# Patient Record
Sex: Female | Born: 2000 | Race: White | Hispanic: No | Marital: Single | Smoking: Never smoker
Health system: Southern US, Community
[De-identification: ages and names within clinical notes are randomized; demographics above are authoritative.]

## PROBLEM LIST (undated history)

## (undated) ENCOUNTER — Inpatient Hospital Stay (HOSPITAL_COMMUNITY): Payer: Self-pay

## (undated) DIAGNOSIS — F419 Anxiety disorder, unspecified: Secondary | ICD-10-CM

## (undated) DIAGNOSIS — F32A Depression, unspecified: Secondary | ICD-10-CM

## (undated) DIAGNOSIS — O139 Gestational [pregnancy-induced] hypertension without significant proteinuria, unspecified trimester: Secondary | ICD-10-CM

## (undated) DIAGNOSIS — F909 Attention-deficit hyperactivity disorder, unspecified type: Secondary | ICD-10-CM

## (undated) DIAGNOSIS — Z789 Other specified health status: Secondary | ICD-10-CM

## (undated) HISTORY — DX: Depression, unspecified: F32.A

## (undated) HISTORY — PX: NO PAST SURGERIES: SHX2092

## (undated) HISTORY — DX: Attention-deficit hyperactivity disorder, unspecified type: F90.9

## (undated) HISTORY — DX: Anxiety disorder, unspecified: F41.9

## (undated) HISTORY — DX: Gestational (pregnancy-induced) hypertension without significant proteinuria, unspecified trimester: O13.9

---
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
## 2019-06-05 ENCOUNTER — Ambulatory Visit (INDEPENDENT_AMBULATORY_CARE_PROVIDER_SITE_OTHER): Payer: Medicaid Other | Admitting: Advanced Practice Midwife

## 2019-06-05 ENCOUNTER — Encounter: Payer: Self-pay | Admitting: Advanced Practice Midwife

## 2019-06-05 DIAGNOSIS — Z3009 Encounter for other general counseling and advice on contraception: Secondary | ICD-10-CM | POA: Diagnosis not present

## 2019-06-05 DIAGNOSIS — Z975 Presence of (intrauterine) contraceptive device: Secondary | ICD-10-CM

## 2019-06-05 NOTE — Progress Notes (Signed)
I connected with Shawntell S Fiero on 06/05/19 at  2:55 PM EDT by telephone and verified that I am speaking with the correct person using two identifiers.  Pt c/o dull achy cramping pain and "pinkish/brownish" vaginal discharge with odor.  Pt thinks sx's are related to the IUD so she wants to remove it.  IUD inserted 03/11/18

## 2019-06-05 NOTE — Progress Notes (Signed)
   [facility] VIRTUAL GYNECOLOGY VISIT ENCOUNTER NOTE  I connected with Karen Spence on 06/05/19 at  2:55 PM EDT by telephone at home and verified that I am speaking with the correct person using two identifiers.   I discussed the limitations, risks, security and privacy concerns of performing an evaluation and management service by telephone and the availability of in person appointments. I also discussed with the patient that there may be a patient responsible charge related to this service. The patient expressed understanding and agreed to proceed.   History:  Karen Spence is a 18 y.o. G58P1001 female being evaluated today for abnormal vaginal discharge and desire to have her Verdia Kuba IUD removed. Patient endorses one month history of brown-tinged vaginal discharge. Onset of vaginal discharge coincides with sexual activity. Patient has had her Kyleena since 03/11/2018 and is concerned that it is the source of her vaginal discharge. She denies  bleeding, pelvic pain or other concerns.  She does not check her strings.     Past Medical History:  Diagnosis Date  . ADHD    Past Surgical History:  Procedure Laterality Date  . NO PAST SURGERIES     The following portions of the patient's history were reviewed and updated as appropriate: allergies, current medications, past family history, past medical history, past social history, past surgical history and problem list.   Health Maintenance:  No pap or mammogram history (age 43).  Review of Systems:  Pertinent items noted in HPI and remainder of comprehensive ROS otherwise negative.  Physical Exam:   General:  Alert, oriented and cooperative.   Mental Status: Normal mood and affect perceived. Normal judgment and thought content.  Physical exam deferred due to nature of the encounter  Labs and Imaging  Assessment and Plan:     1. Birth control counseling --Patient has been successful and content with her IUD until about one  month ago. Given age and excellent success rate of IUD,advised keeping IUD in place, making appointment for cervicovaginal swab, treating PRN and reevaluating need for IUD removal --Patient desires removal ASAP --Reviewed all forms of birth control options available including abstinence; fertility period awareness methods; over the counter/barrier methods; hormonal contraceptive medication including pill, patch, ring, injection,contraceptive implant; hormonal and nonhormonal IUDs. I did not discuss permanent sterilization options including vasectomy and the various tubal sterilization modalities. Risks and benefits reviewed.  Questions were answered.  Information was given to patient to review.  --Patient referred to bedsider.org for comparison tools and patient interviews   2. IUD (intrauterine device) in place --[place] --Placed 03/11/2018   I discussed the assessment and treatment plan with the patient. The patient was provided an opportunity to ask questions and all were answered. The patient agreed with the plan and demonstrated an understanding of the instructions.   The patient was advised to call back or seek an in-person evaluation/go to the ED if the symptoms worsen or if the condition fails to improve as anticipated.  Follow-up: --Patient plans to schedule appointment for IUD removal and vaginal swab  I provided 15 minutes of face-to-face time via Webex during this encounter.   Darlina Rumpf, [place] for [employer], [place]

## 2019-06-17 ENCOUNTER — Ambulatory Visit (INDEPENDENT_AMBULATORY_CARE_PROVIDER_SITE_OTHER): Payer: Medicaid Other | Admitting: Family Medicine

## 2019-06-17 ENCOUNTER — Other Ambulatory Visit (HOSPITAL_COMMUNITY)
Admission: RE | Admit: 2019-06-17 | Discharge: 2019-06-17 | Disposition: A | Discharge status: Home / Self Care | Payer: Medicaid Other | Source: Ambulatory Visit | Attending: Family Medicine | Admitting: Family Medicine

## 2019-06-17 ENCOUNTER — Other Ambulatory Visit: Payer: Self-pay

## 2019-06-17 ENCOUNTER — Encounter: Payer: Self-pay | Admitting: Family Medicine

## 2019-06-17 VITALS — BP 114/71 | HR 91 | Wt 111.0 lb

## 2019-06-17 DIAGNOSIS — Z30432 Encounter for removal of intrauterine contraceptive device: Secondary | ICD-10-CM

## 2019-06-17 DIAGNOSIS — N898 Other specified noninflammatory disorders of vagina: Secondary | ICD-10-CM

## 2019-06-17 DIAGNOSIS — Z8759 Personal history of other complications of pregnancy, childbirth and the puerperium: Secondary | ICD-10-CM | POA: Insufficient documentation

## 2019-06-17 NOTE — Progress Notes (Signed)
   Subjective:    Patient ID: Karen Spence is a 18 y.o. female presenting with Gynecologic Exam (iud removal)  on 06/17/2019  HPI: Here today for IUD removal. Notes pain and bleeding following intercourse. Also 1 month h/o vaginal discharge. Working in [employer]. Lives with boyfriend and 20 yo daughter. Does not want any other contraception. OK with pregnancy.  Review of Systems  Constitutional: Negative for chills and fever.  Respiratory: Negative for shortness of breath.   Cardiovascular: Negative for chest pain.  Gastrointestinal: Negative for abdominal pain, nausea and vomiting.  Genitourinary: Positive for vaginal discharge. Negative for dysuria.  Skin: Negative for rash.      Objective:    BP 114/71   Pulse 91   Wt 111 lb (50.3 kg)  Physical Exam Constitutional:      General: She is not in acute distress.    Appearance: She is well-developed.  HENT:     Head: Normocephalic and atraumatic.  Eyes:     General: No scleral icterus. Neck:     Musculoskeletal: Neck supple.  Cardiovascular:     Rate and Rhythm: Normal rate.  Pulmonary:     Effort: Pulmonary effort is normal.  Abdominal:     Palpations: Abdomen is soft.  Skin:    General: Skin is warm and dry.  Neurological:     Mental Status: She is alert and oriented to person, place, and time.    Procedure: Speculum placed inside vagina.  Cervix visualized.  Strings grasped with ring forceps.  IUD removed intact.      Assessment & Plan:   Total face-to-face time with patient: 10 minutes. Over 50% of encounter was spent on counseling and coordination of care. Return if symptoms worsen or fail to improve.  Donnamae Jude 06/17/2019 11:24 AM

## 2019-06-18 LAB — CERVICOVAGINAL ANCILLARY ONLY
Bacterial vaginitis: NEGATIVE
Candida vaginitis: NEGATIVE
Chlamydia: NEGATIVE
Neisseria Gonorrhea: NEGATIVE
Trichomonas: NEGATIVE

## 2019-09-20 ENCOUNTER — Inpatient Hospital Stay (HOSPITAL_COMMUNITY)
Admission: AD | Admit: 2019-09-20 | Discharge: 2019-09-20 | Disposition: A | Discharge status: Home / Self Care | Payer: Medicaid Other | Attending: Obstetrics & Gynecology | Admitting: Obstetrics & Gynecology

## 2019-09-20 ENCOUNTER — Encounter (HOSPITAL_COMMUNITY): Payer: Self-pay

## 2019-09-20 ENCOUNTER — Other Ambulatory Visit: Payer: Self-pay

## 2019-09-20 DIAGNOSIS — O26891 Other specified pregnancy related conditions, first trimester: Secondary | ICD-10-CM

## 2019-09-20 DIAGNOSIS — R55 Syncope and collapse: Secondary | ICD-10-CM

## 2019-09-20 DIAGNOSIS — Z3A13 13 weeks gestation of pregnancy: Secondary | ICD-10-CM | POA: Insufficient documentation

## 2019-09-20 HISTORY — DX: Other specified health status: Z78.9

## 2019-09-20 LAB — URINALYSIS, ROUTINE W REFLEX MICROSCOPIC
Bilirubin Urine: NEGATIVE
Glucose, UA: NEGATIVE mg/dL
Hgb urine dipstick: NEGATIVE
Ketones, ur: NEGATIVE mg/dL
Leukocytes,Ua: NEGATIVE
Nitrite: NEGATIVE
Protein, ur: NEGATIVE mg/dL
Specific Gravity, Urine: 1.014 (ref 1.005–1.030)
pH: 7 (ref 5.0–8.0)

## 2019-09-20 LAB — CBC
HCT: 36.8 % (ref 36.0–46.0)
Hemoglobin: 12 g/dL (ref 12.0–15.0)
MCH: 27.7 pg (ref 26.0–34.0)
MCHC: 32.6 g/dL (ref 30.0–36.0)
MCV: 85 fL (ref 80.0–100.0)
Platelets: 203 10*3/uL (ref 150–400)
RBC: 4.33 MIL/uL (ref 3.87–5.11)
RDW: 13.2 % (ref 11.5–15.5)
WBC: 6.2 10*3/uL (ref 4.0–10.5)
nRBC: 0 % (ref 0.0–0.2)

## 2019-09-20 LAB — POCT PREGNANCY, URINE: Preg Test, Ur: POSITIVE — AB

## 2019-09-20 NOTE — MAU Provider Note (Signed)
History     CSN: [id number]  Arrival date and time: 09/20/19 1237   First Provider Initiated Contact with Patient 09/20/19 1655     Chief Complaint  Patient presents with  . Loss of Consciousness   Karen Spence is a 18 y.o. G2P1 at [redacted]w[redacted]d who presents to MAU with complaints of syncope. She reports this occurred yesterday and today is experiencing no symptoms. She reports laying in the bed and getting up to go to restroom because her fiance called her, when she got up to head to restroom vision began to get blurry and she passed out and fell on back. She reports this occurred in previous pregnancy and has occurred twice in this pregnancy. She denies abdominal pain, back pain, vaginal bleeding or discharge. She receives prenatal care at [facility].    OB History    Gravida  2   Para  1   Term  1   Preterm      AB      Living  1     SAB      TAB      Ectopic      Multiple      Live Births              Past Medical History:  Diagnosis Date  . Medical history non-contributory     Past Surgical History:  Procedure Laterality Date  . NO PAST SURGERIES      Family History  Problem Relation Age of Onset  . Diabetes Father   . Diabetes Maternal Grandmother     Social History   Tobacco Use  . Smoking status: Never Smoker  . Tobacco comment: once or twice she states  Substance Use Topics  . Alcohol use: Not Currently  . Drug use: Not Currently    Allergies: No Known Allergies  No medications prior to admission.    Review of Systems  Constitutional: Negative.   Respiratory: Negative.   Cardiovascular: Negative.   Gastrointestinal: Negative.   Genitourinary: Negative.   Musculoskeletal: Negative.   Neurological: Positive for syncope. Negative for dizziness and weakness.       Yesterday   Physical Exam   Blood pressure 113/68, pulse 63, temperature 98.5 F (36.9 C), temperature source Oral, resp. rate 16, SpO2 100 %.  Physical Exam   Nursing note and vitals reviewed. Constitutional: She is oriented to person, place, and time. She appears well-developed and well-nourished. No distress.  Cardiovascular: Normal rate and regular rhythm.  Respiratory: Effort normal and breath sounds normal. No respiratory distress. She has no wheezes.  GI: Soft. There is no abdominal tenderness. There is no rebound and no guarding.  Musculoskeletal: Normal range of motion.        General: No edema.  Neurological: She is alert and oriented to person, place, and time. She displays normal reflexes. No cranial nerve deficit. She exhibits normal muscle tone. Coordination normal.  Skin: Skin is warm and dry.  Psychiatric: She has a normal mood and affect. Her behavior is normal. Thought content normal.   FHR 154 by doppler   MAU Course  Procedures  MDM CBC  Orthostatic vital signs   Labs reviewed:  Results for orders placed or performed during the hospital encounter of 09/20/19 (from the past 24 hour(s))  Pregnancy, urine POC     Status: Abnormal   Collection Time: 09/20/19  1:31 PM  Result Value Ref Range   Preg Test, Ur POSITIVE (A) NEGATIVE  Urinalysis, Routine  w reflex microscopic     Status: Abnormal   Collection Time: 09/20/19  2:03 PM  Result Value Ref Range   Color, Urine YELLOW YELLOW   APPearance HAZY (A) CLEAR   Specific Gravity, Urine 1.014 1.005 - 1.030   pH 7.0 5.0 - 8.0   Glucose, UA NEGATIVE NEGATIVE mg/dL   Hgb urine dipstick NEGATIVE NEGATIVE   Bilirubin Urine NEGATIVE NEGATIVE   Ketones, ur NEGATIVE NEGATIVE mg/dL   Protein, ur NEGATIVE NEGATIVE mg/dL   Nitrite NEGATIVE NEGATIVE   Leukocytes,Ua NEGATIVE NEGATIVE  CBC     Status: None   Collection Time: 09/20/19  2:12 PM  Result Value Ref Range   WBC 6.2 4.0 - 10.5 K/uL   RBC 4.33 3.87 - 5.11 MIL/uL   Hemoglobin 12.0 12.0 - 15.0 g/dL   HCT 36.8 36.0 - 46.0 %   MCV 85.0 80.0 - 100.0 fL   MCH 27.7 26.0 - 34.0 pg   MCHC 32.6 30.0 - 36.0 g/dL   RDW 13.2 11.5  - 15.5 %   Platelets 203 150 - 400 K/uL   nRBC 0.0 0.0 - 0.2 %   Orthostatic vital signs: 115/63, 112/63, 103/57, and 108/64  Educated and discussed slow position changes, eating every 2-3 hours, increasing water consumption to 6-8 bottles per day. Discussed reasons to return to MAU, follow up as scheduled for prenatal appointments. Pt stable at time of discharge.  Assessment and Plan   1. Syncope, unspecified syncope type   2. [redacted] weeks gestation of pregnancy    Discharge home  Follow up as scheduled for prenatal appointments  Return to MAU as needed  [place], [facility] Follow up.   Specialty: Obstetrics and Gynecology Contact information: [place] [phone] [place] 09/20/2019, 7:27 PM

## 2019-09-20 NOTE — MAU Note (Signed)
Labs being drawn in triage

## 2019-09-20 NOTE — MAU Note (Signed)
Karen Spence Score is a 18 y.o.  here in MAU reporting: states last night when she was at home her vision went black and she passed out. Took a nap and woke up with a headache. Thinks her BP was high. States she currently feels okay. No pain or bleeding.   Onset of complaint: last night  Pain score: 0/10  Vitals:   09/20/19 1329  BP: 113/68  Pulse: 63  Resp: 16  Temp: 98.5 F (36.9 C)  SpO2: 100%     FHT: 154  Lab orders placed from triage: UA, UPT

## 2019-09-20 NOTE — Discharge Instructions (Signed)
Eating Plan for Pregnant Women While you are pregnant, your body requires additional nutrition to help support your growing baby. You also have a higher need for some vitamins and minerals, such as folic acid, calcium, iron, and vitamin D. Eating a healthy, well-balanced diet is very important for your health and your baby's health. Your need for extra calories varies for the three 53-monthsegments of your pregnancy (trimesters). For most women, it is recommended to consume:  150 extra calories a day during the first trimester.  300 extra calories a day during the second trimester.  300 extra calories a day during the third trimester. What are tips for following this plan?   Do not try to lose weight or go on a diet during pregnancy.  Limit your overall intake of foods that have "empty calories." These are foods that have little nutritional value, such as sweets, desserts, candies, and sugar-sweetened beverages.  Eat a variety of foods (especially fruits and vegetables) to get a full range of vitamins and minerals.  Take a prenatal vitamin to help meet your additional vitamin and mineral needs during pregnancy, specifically for folic acid, iron, calcium, and vitamin D.  Remember to stay active. Ask your health care provider what types of exercise and activities are safe for you.  Practice good food safety and cleanliness. Wash your hands before you eat and after you prepare raw meat. Wash all fruits and vegetables well before peeling or eating. Taking these actions can help to prevent food-borne illnesses that can be very dangerous to your baby, such as listeriosis. Ask your health care provider for more information about listeriosis. What does 150 extra calories look like? Healthy options that provide 150 extra calories each day could be any of the following:  6-8 oz (170-230 g) of plain low-fat yogurt with  cup of berries.  1 apple with 2 teaspoons (11 g) of peanut butter.  Cut-up  vegetables with  cup (60 g) of hummus.  8 oz (230 mL) or 1 cup of low-fat chocolate milk.  1 stick of string cheese with 1 medium orange.  1 peanut butter and jelly sandwich that is made with one slice of whole-wheat bread and 1 tsp (5 g) of peanut butter. For 300 extra calories, you could eat two of those healthy options each day. What is a healthy amount of weight to gain? The right amount of weight gain for you is based on your BMI before you became pregnant. If your BMI:  Was less than 18 (underweight), you should gain 28-40 lb (13-18 kg).  Was 18-24.9 (normal), you should gain 25-35 lb (11-16 kg).  Was 25-29.9 (overweight), you should gain 15-25 lb (7-11 kg).  Was 30 or greater (obese), you should gain 11-20 lb (5-9 kg). What if I am having twins or multiples? Generally, if you are carrying twins or multiples:  You may need to eat 300-600 extra calories a day.  The recommended range for total weight gain is 25-54 lb (11-25 kg), depending on your BMI before pregnancy.  Talk with your health care provider to find out about nutritional needs, weight gain, and exercise that is right for you. What foods can I eat?  Grains All grains. Choose whole grains, such as whole-wheat bread, oatmeal, or brown rice. Vegetables All vegetables. Eat a variety of colors and types of vegetables. Remember to wash your vegetables well before peeling or eating. Fruits All fruits. Eat a variety of colors and types of fruit. Remember to wash  your fruits well before peeling or eating. Meats and other protein foods Lean meats, including chicken, [place], fish, and lean cuts of beef, veal, or pork. If you eat fish or seafood, choose options that are higher in omega-3 fatty acids and lower in mercury, such as salmon, herring, mussels, trout, sardines, pollock, shrimp, crab, and lobster. Tofu. Tempeh. Beans. Eggs. Peanut butter and other nut butters. Make sure that all meats, poultry, and eggs are cooked to  food-safe temperatures or "well-done." Two or more servings of fish are recommended each week in order to get the most benefits from omega-3 fatty acids that are found in seafood. Choose fish that are lower in mercury. You can find more information online:  GuamGaming.ch Dairy Pasteurized milk and milk alternatives (such as almond milk). Pasteurized yogurt and pasteurized cheese. Cottage cheese. Sour cream. Beverages Water. Juices that contain 100% fruit juice or vegetable juice. Caffeine-free teas and decaffeinated coffee. Drinks that contain caffeine are okay to drink, but it is better to avoid caffeine. Keep your total caffeine intake to less than 200 mg each day (which is 12 oz or 355 mL of coffee, tea, or soda) or the limit as told by your health care provider. Fats and oils Fats and oils are okay to include in moderation. Sweets and desserts Sweets and desserts are okay to include in moderation. Seasoning and other foods All pasteurized condiments. The items listed above may not be a complete list of recommended foods and beverages. Contact your dietitian for more options. The items listed above may not be a complete list of foods and beverages [you/your child] can eat. Contact a dietitian for more information. What foods are not recommended? Vegetables Raw (unpasteurized) vegetable juices. Fruits Unpasteurized fruit juices. Meats and other protein foods Lunch meats, bologna, hot dogs, or other deli meats. (If you must eat those meats, reheat them until they are steaming hot.) Refrigerated pat, meat spreads from a meat counter, smoked seafood that is found in the refrigerated section of a store. Raw or undercooked meats, poultry, and eggs. Raw fish, such as sushi or sashimi. Fish that have high mercury content, such as tilefish, shark, swordfish, and king mackerel. To learn more about mercury in fish, talk with your health care provider or look for online resources, such  as:  [employer]Gaming.ch Dairy Raw (unpasteurized) milk and any foods that have raw milk in them. Soft cheeses, such as feta, queso blanco, queso fresco, Brie, Camembert cheeses, blue-veined cheeses, and Panela cheese (unless it is made with pasteurized milk, which must be stated on the label). Beverages Alcohol. Sugar-sweetened beverages, such as sodas, teas, or energy drinks. Seasoning and other foods Homemade fermented foods and drinks, such as pickles, sauerkraut, or kombucha drinks. (Store-bought pasteurized versions of these are okay.) Salads that are made in a store or deli, such as ham salad, chicken salad, egg salad, tuna salad, and seafood salad. The items listed above may not be a complete list of foods and beverages to avoid. Contact your dietitian for more information. The items listed above may not be a complete list of foods and beverages [you/your child] should avoid. Contact a dietitian for more information. Where to find more information To calculate the number of calories you need based on your height, weight, and activity level, you can use an online calculator such as:  MobileTransition.ch To calculate how much weight you should gain during pregnancy, you can use an online pregnancy weight gain calculator such as:  StreamingFood.com.cy Summary  While you  are pregnant, your body requires additional nutrition to help support your growing baby.  Eat a variety of foods, especially fruits and vegetables to get a full range of vitamins and minerals.  Practice good food safety and cleanliness. Wash your hands before you eat and after you prepare raw meat. Wash all fruits and vegetables well before peeling or eating. Taking these actions can help to prevent food-borne illnesses, such as listeriosis, that can be very dangerous to your baby.  Do not eat raw meat or fish. Do not eat fish that have high mercury content, such as tilefish,  shark, swordfish, and king mackerel. Do not eat unpasteurized (raw) dairy.  Take a prenatal vitamin to help meet your additional vitamin and mineral needs during pregnancy, specifically for folic acid, iron, calcium, and vitamin D. This information is not intended to replace advice given to you by your health care provider. Make sure you discuss any questions you have with your health care provider. Document Released: 09/25/2014 Document Revised: 04/03/2019 Document Reviewed: 09/07/2017 Elsevier Patient Education  [place] Medications in Pregnancy   Acne: Benzoyl Peroxide Salicylic Acid  Backache/Headache: Tylenol: 2 regular strength every 4 hours OR              2 Extra strength every 6 hours  Colds/Coughs/Allergies: Benadryl (alcohol free) 25 mg every 6 hours as needed Breath right strips Claritin Cepacol throat lozenges Chloraseptic throat spray Cold-Eeze- up to three times per day Cough drops, alcohol free Flonase (by prescription only) Guaifenesin Mucinex Robitussin DM (plain only, alcohol free) Saline nasal spray/drops Sudafed (pseudoephedrine) & Actifed ** use only after [redacted] weeks gestation and if you do not have high blood pressure Tylenol Vicks Vaporub Zinc lozenges Zyrtec   Constipation: Colace Ducolax suppositories Fleet enema Glycerin suppositories Metamucil Milk of magnesia Miralax Senokot Smooth move tea  Diarrhea: Kaopectate Imodium A-D  *NO pepto Bismol  Hemorrhoids: Anusol Anusol HC Preparation H Tucks  Indigestion: Tums Maalox Mylanta Zantac  Pepcid  Insomnia: Benadryl (alcohol free) 25mg  every 6 hours as needed Tylenol PM Unisom, no Gelcaps  Leg Cramps: Tums MagGel  Nausea/Vomiting:  Bonine Dramamine Emetrol Ginger extract Sea bands Meclizine  Nausea medication to take during pregnancy:  Unisom (doxylamine succinate 25 mg tablets) Take one tablet daily at bedtime. If symptoms are not adequately  controlled, the dose can be increased to a maximum recommended dose of two tablets daily (1/2 tablet in the morning, 1/2 tablet mid-afternoon and one at bedtime). Vitamin B6 100mg  tablets. Take one tablet twice a day (up to 200 mg per day).  Skin Rashes: Aveeno products Benadryl cream or 25mg  every 6 hours as needed Calamine Lotion 1% cortisone cream  Yeast infection: Gyne-lotrimin 7 Monistat 7   **If taking multiple medications, please check labels to avoid duplicating the same active ingredients **take medication as directed on the label ** Do not exceed 4000 mg of tylenol in 24 hours **Do not take medications that contain aspirin or ibuprofen

## 2019-09-22 ENCOUNTER — Encounter (HOSPITAL_COMMUNITY): Payer: Self-pay

## 2019-09-22 ENCOUNTER — Encounter: Payer: Self-pay | Admitting: Family Medicine

## 2020-02-20 DIAGNOSIS — R102 Pelvic and perineal pain: Secondary | ICD-10-CM | POA: Diagnosis not present

## 2020-02-20 DIAGNOSIS — O26893 Other specified pregnancy related conditions, third trimester: Secondary | ICD-10-CM | POA: Diagnosis present

## 2020-02-20 DIAGNOSIS — M545 Low back pain: Secondary | ICD-10-CM | POA: Diagnosis not present

## 2020-02-20 DIAGNOSIS — Z3A36 36 weeks gestation of pregnancy: Secondary | ICD-10-CM | POA: Insufficient documentation

## 2020-02-20 DIAGNOSIS — R0602 Shortness of breath: Secondary | ICD-10-CM | POA: Diagnosis not present

## 2020-02-20 DIAGNOSIS — Z3A35 35 weeks gestation of pregnancy: Secondary | ICD-10-CM | POA: Diagnosis not present

## 2020-02-21 ENCOUNTER — Inpatient Hospital Stay (HOSPITAL_COMMUNITY): Payer: Medicaid Other

## 2020-02-21 ENCOUNTER — Other Ambulatory Visit: Payer: Self-pay

## 2020-02-21 ENCOUNTER — Encounter (HOSPITAL_COMMUNITY): Payer: Self-pay | Admitting: Emergency Medicine

## 2020-02-21 ENCOUNTER — Inpatient Hospital Stay (HOSPITAL_COMMUNITY)
Admission: EM | Admit: 2020-02-21 | Discharge: 2020-02-21 | Disposition: A | Discharge status: Other Acute Inpatient Hospital | Payer: Medicaid Other | Attending: Obstetrics and Gynecology | Admitting: Obstetrics and Gynecology

## 2020-02-21 DIAGNOSIS — M545 Low back pain, unspecified: Secondary | ICD-10-CM

## 2020-02-21 DIAGNOSIS — R102 Pelvic and perineal pain: Secondary | ICD-10-CM | POA: Diagnosis not present

## 2020-02-21 DIAGNOSIS — O26893 Other specified pregnancy related conditions, third trimester: Secondary | ICD-10-CM | POA: Diagnosis not present

## 2020-02-21 DIAGNOSIS — R0602 Shortness of breath: Secondary | ICD-10-CM | POA: Diagnosis not present

## 2020-02-21 DIAGNOSIS — Z3A35 35 weeks gestation of pregnancy: Secondary | ICD-10-CM

## 2020-02-21 LAB — URINALYSIS, ROUTINE W REFLEX MICROSCOPIC
Bilirubin Urine: NEGATIVE
Glucose, UA: >=500 mg/dL — AB
Hgb urine dipstick: NEGATIVE
Ketones, ur: NEGATIVE mg/dL
Leukocytes,Ua: NEGATIVE
Nitrite: NEGATIVE
Protein, ur: NEGATIVE mg/dL
Specific Gravity, Urine: 1.008 (ref 1.005–1.030)
pH: 7 (ref 5.0–8.0)

## 2020-02-21 LAB — AMNISURE RUPTURE OF MEMBRANE (ROM) NOT AT ARMC: Amnisure ROM: NEGATIVE

## 2020-02-21 MED ORDER — CYCLOBENZAPRINE HCL 5 MG PO TABS: 5.0000 mg | ORAL_TABLET | Freq: Three times a day (TID) | ORAL | 0 refills | Status: DC | PRN

## 2020-02-21 MED ORDER — CYCLOBENZAPRINE HCL 5 MG PO TABS: 5.0000 mg | ORAL_TABLET | Freq: Once | ORAL | Status: DC

## 2020-02-21 MED ORDER — COMFORT FIT MATERNITY SUPP MED MISC: 1.0000 | Freq: Every day | 0 refills | Status: DC

## 2020-02-21 NOTE — ED Triage Notes (Signed)
Patient is [redacted] weeks pregnant G2P1 , reports right hip pain onset yesterday morning , denies injury or fall , denies abdominal contractions or vaginal bleeding .

## 2020-02-21 NOTE — MAU Provider Note (Signed)
Chief Complaint:  [redacted] Weeks Pregnant/ Right Hip pain   None     HPI: Karen Spence is a 19 y.o. G2P1001 at [redacted]w[redacted]d by LMP who presents to maternity admissions reporting pain in her right hip/right low back and shortness of breath, both starting today.  She also reports leaking of clear fluid daily with a large gush on 02/16/20, then soaking pantyliners but not requiring pads since then.  She reports that her right hip started hurting, worse when she lies on her left side or when she stands and shifts her weight from leg to leg. She then laid on her back to relieve the hip pain. While supine, she started having shortness of breath. It has improved, but not resolved since she sat up. She has no respiratory symptoms, no cough, sore throat or congestion. There is no chest pain. She denies any known sick contacts. She denies any regular contractions.  She reports good fetal movement. There are no other symptoms. She has not tried any other treatments.   HPI  Past Medical History: Past Medical History:  Diagnosis Date  . ADHD   . Medical history non-contributory     Past obstetric history: OB History  Gravida Para Term Preterm AB Living  2 1 1  0 0 1  SAB TAB Ectopic Multiple Live Births  0 0 0   1    # Outcome Date GA Lbr Len/2nd Weight Sex Delivery Anes PTL Lv  2 Current           1 Term 12/26/17 [redacted]w[redacted]d    Vag-Spont   LIV    Past Surgical History: Past Surgical History:  Procedure Laterality Date  . NO PAST SURGERIES      Family History: Family History  Problem Relation Age of Onset  . Diabetes Father   . Diabetes Maternal Grandmother   . Hypertension Father   . Hypertension Maternal Grandmother   . Kidney failure Maternal Grandmother   . COPD Maternal Grandfather   . Hypertension Mother   . Hypertension Paternal Grandfather   . Lung cancer Paternal Grandfather     Social History: Social History   Tobacco Use  . Smoking status: Never Smoker  . Smokeless tobacco:  Never Used  . Tobacco comment: once or twice she states  Substance Use Topics  . Alcohol use: Not Currently  . Drug use: Not Currently    Allergies: No Known Allergies  Meds:  Medications Prior to Admission  Medication Sig Dispense Refill Last Dose  . acetaminophen (TYLENOL) 325 MG tablet Take 650 mg by mouth every 6 (six) hours as needed for mild pain or headache.   02/20/2020 at Unknown time  . ibuprofen (ADVIL,MOTRIN) 200 MG tablet Take 600 mg by mouth every 6 (six) hours as needed.        ROS:  Review of Systems  Constitutional: Negative for chills, fatigue and fever.  Eyes: Negative for visual disturbance.  Respiratory: Positive for shortness of breath.   Cardiovascular: Negative for chest pain.  Gastrointestinal: Negative for abdominal pain, nausea and vomiting.  Genitourinary: Positive for pelvic pain. Negative for difficulty urinating, dysuria, flank pain, vaginal bleeding, vaginal discharge and vaginal pain.  Musculoskeletal: Positive for back pain.  Neurological: Negative for dizziness and headaches.  Psychiatric/Behavioral: Negative.      I have reviewed patient's Past Medical Hx, Surgical Hx, Family Hx, Social Hx, medications and allergies.   Physical Exam   Patient Vitals for the past 24 hrs:  BP Temp Temp  src Pulse Resp SpO2 Height Weight  02/21/20 0050 113/64 98.5 F (36.9 C) Oral 91 20 -- -- --  02/21/20 0049 -- -- -- -- -- 100 % -- --  02/21/20 0024 -- -- -- -- -- -- 5\' 2"  (1.575 m) 65 kg  02/21/20 0010 127/80 98.1 F (36.7 C) Oral (!) 109 20 98 % -- --   Constitutional: Well-developed, well-nourished female in no acute distress.  HEART: normal rate, heart sounds, regular rhythm RESP: normal effort, lung sounds clear and equal bilaterally GI: Abd soft, non-tender, gravid appropriate for gestational age.  MS: Extremities nontender, no edema, normal ROM Neurologic: Alert and oriented x 4.  GU: Neg CVAT.  PELVIC EXAM: Amnisure collected  Dilation:  1 Effacement (%): 50 Cervical Position: Posterior Exam by:: Fatima Blank, CNM  FHT:  Baseline 120, moderate variability, accelerations present, no decelerations Contractions: None on toco or to palpation   Labs: Results for orders placed or performed during the hospital encounter of 02/21/20 (from the past 24 hour(s))  Urinalysis, Routine w reflex microscopic     Status: Abnormal   Collection Time: 02/21/20  1:19 AM  Result Value Ref Range   Color, Urine STRAW (A) YELLOW   APPearance CLEAR CLEAR   Specific Gravity, Urine 1.008 1.005 - 1.030   pH 7.0 5.0 - 8.0   Glucose, UA >=500 (A) NEGATIVE mg/dL   Hgb urine dipstick NEGATIVE NEGATIVE   Bilirubin Urine NEGATIVE NEGATIVE   Ketones, ur NEGATIVE NEGATIVE mg/dL   Protein, ur NEGATIVE NEGATIVE mg/dL   Nitrite NEGATIVE NEGATIVE   Leukocytes,Ua NEGATIVE NEGATIVE   RBC / HPF 0-5 0 - 5 RBC/hpf   WBC, UA 0-5 0 - 5 WBC/hpf   Bacteria, UA RARE (A) NONE SEEN   Squamous Epithelial / LPF 0-5 0 - 5  Amnisure rupture of membrane (rom)not at [facility]     Status: None   Collection Time: 02/21/20  1:52 AM  Result Value Ref Range   Amnisure ROM NEGATIVE       Imaging:  DG Chest Portable 1 View  Result Date: 02/21/2020 CLINICAL DATA:  Shortness of breath EXAM: PORTABLE CHEST 1 VIEW COMPARISON:  None. FINDINGS: The heart size and mediastinal contours are within normal limits. Both lungs are clear. The visualized skeletal structures are unremarkable. IMPRESSION: No active disease. Electronically Signed   By: Prudencio Pair M.D.   On: 02/21/2020 02:26    MAU Course/MDM: Orders Placed This Encounter  Procedures  . DG Chest Portable 1 View  . Urinalysis, Routine w reflex microscopic  . Amnisure rupture of membrane (rom)not at [facility]  . ED EKG  . Discharge patient    Meds ordered this encounter  Medications  . DISCONTD: cyclobenzaprine (FLEXERIL) tablet 5 mg  . cyclobenzaprine (FLEXERIL) 5 MG tablet    Sig: Take 1-2 tablets (5-10 mg total)  by mouth 3 (three) times daily as needed for muscle spasms.    Dispense:  15 tablet    Refill:  0    Order Specific Question:   Supervising Provider    Answer:   Jonnie Kind [[id number]]  . Elastic Bandages & Supports (COMFORT FIT MATERNITY SUPP MED) MISC    Sig: 1 Device by Does not apply route daily.    Dispense:  1 each    Refill:  0    Order Specific Question:   Supervising Provider    Answer:   Jonnie Kind [[id number]]     NST reviewed and reactive No signs  of labor, cervix 1/50/-3, posterior, no contractions to palpation or on the toco Amnisure negative, no evidence of PPROM Lung and heart sounds normal, CXR normal, EKG normal SOB likely started with pt supine, and has significantly improved No evidence of pathology with SOB No s/sx of respiratory infection or sick contacts --Rest/ice/heat/warm bath/Tylenol/pregnancy support belt Rx for Flexeril 5-10 mg TID PRN Rx for maternity support belt F/U with [facility] prenatal provider in [facility] as scheduled Return to MAU with signs of labor or emergencies  Assessment: 1. Pelvic pain affecting pregnancy in third trimester, antepartum   2. Acute right-sided low back pain, unspecified whether sciatica present   3. Shortness of breath due to pregnancy in third trimester     Plan: Discharge home Labor precautions and fetal kick counts Follow-up Information    Your Prenatal Provider Follow up.   Why: As scheduled. Return to MAU as needed for emergencies.          Allergies as of 02/21/2020   No Known Allergies     Medication List    STOP taking these medications   ibuprofen 200 MG tablet Commonly known as: ADVIL     TAKE these medications   acetaminophen 325 MG tablet Commonly known as: TYLENOL Take 650 mg by mouth every 6 (six) hours as needed for mild pain or headache.   Comfort Fit Maternity Supp Med Misc 1 Device by Does not apply route daily.   cyclobenzaprine 5 MG tablet Commonly known as: FLEXERIL Take 1-2  tablets (5-10 mg total) by mouth 3 (three) times daily as needed for muscle spasms.       Fatima Blank Certified Nurse-Midwife 02/21/2020 3:00 AM

## 2020-02-21 NOTE — MAU Note (Signed)
Patient reports to MAU from [facility] reporting pain in her right hip. Pt is rating the pain a 4/10 currently. Pt has not taken any medication for the pain. Pt reports +FM. No bleeding or LOF. Pt reports feeling irregular ctx. Pt is also c/o SOB. Pt reports HA. Pt reports constipation pt states she had some bleeding on her tissue when she wiped her rectum.

## 2020-02-21 NOTE — ED Provider Notes (Signed)
MSE was initiated and I personally evaluated the patient and placed orders (if any) at  12:41 AM on February 21, 2020.  The patient appears stable so that the remainder of the MSE may be completed by another provider.  Karen Spence is a 19 y.o. female G2P1 at 79 weeks presents for ongoing waxing and waning bilateral back pain worst in the right low back.  Pt reports approx 2 hours ago she began to feel short of breath.  No fever, cough.  No sick contacts.  No abd pain, vaginal bleeding, leaking of fluid.    BP 127/80 (BP Location: Right Arm)   Pulse (!) 109   Temp 98.1 F (36.7 C) (Oral)   Resp 20   Ht 5\' 2"  (1.575 m)   Wt 65 kg   SpO2 98%   BMI 26.21 kg/m    Face to face Exam:   General: Awake  HEENT: Atraumatic  Resp: Normal effort, mild tachypnea Cardiac: tachycardia  Abd: Gravid, soft, nontender  Neuro:No focal weakness  Psyc: anxious   12:37 AM Discussed with Lattie Haw at [facility] MAU who will take patietn in transfer.     Acute right-sided low back pain, unspecified whether sciatica present     [employer]ito Games [phone] [id number]    Delora Fuel, MD 0000000 [phone]

## 2020-02-21 NOTE — Discharge Instructions (Signed)
   PREGNANCY SUPPORT BELT: °You are not alone, Seventy-five percent of women have some sort of abdominal or back pain at some point in their pregnancy. Your baby is growing at a fast pace, which means that your whole body is rapidly trying to adjust to the changes. As your uterus grows, your back may start feeling a bit under stress and this can result in back or abdominal pain that can go from mild, and therefore bearable, to severe pains that will not allow you to sit or lay down comfortably, When it comes to dealing with pregnancy-related pains and cramps, some pregnant women usually prefer natural remedies, which the market is filled with nowadays. For example, wearing a pregnancy support belt can help ease and lessen your discomfort and pain. °WHAT ARE THE BENEFITS OF WEARING A PREGNANCY SUPPORT BELT? A pregnancy support belt provides support to the lower portion of the belly taking some of the weight of the growing uterus and distributing to the other parts of your body. It is designed make you comfortable and gives you extra support. Over the years, the pregnancy apparel market has been studying the needs and wants of pregnant women and they have come up with the most comfortable pregnancy support belts that woman could ever ask for. In fact, you will no longer have to wear a stretched-out or bulky pregnancy belt that is visible underneath your clothes and makes you feel even more uncomfortable. Nowadays, a pregnancy support belt is made of comfortable and stretchy materials that will not irritate your skin but will actually make you feel at ease and you will not even notice you are wearing it. They are easy to put on and adjust during the day and can be worn at night for additional support.  °BENEFITS: °• Relives Back pain °• Relieves Abdominal Muscle and Leg Pain °• Stabilizes the Pelvic Ring °• Offers a Cushioned Abdominal Lift Pad °• Relieves pressure on the Sciatic Nerve Within Minutes °WHERE TO GET  YOUR PREGNANCY BELT: [employer] [phone] @[address] [place], [place] [address] ° ° °

## 2020-07-05 ENCOUNTER — Other Ambulatory Visit: Payer: Self-pay

## 2020-07-05 ENCOUNTER — Inpatient Hospital Stay (HOSPITAL_COMMUNITY)
Admission: AD | Admit: 2020-07-05 | Discharge: 2020-07-05 | Disposition: A | Discharge status: Home / Self Care | Payer: Medicaid Other | Attending: Obstetrics and Gynecology | Admitting: Obstetrics and Gynecology

## 2020-07-05 ENCOUNTER — Encounter (HOSPITAL_COMMUNITY): Payer: Self-pay | Admitting: Obstetrics and Gynecology

## 2020-07-05 DIAGNOSIS — Z789 Other specified health status: Secondary | ICD-10-CM

## 2020-07-05 DIAGNOSIS — R1030 Lower abdominal pain, unspecified: Secondary | ICD-10-CM | POA: Insufficient documentation

## 2020-07-05 DIAGNOSIS — N939 Abnormal uterine and vaginal bleeding, unspecified: Secondary | ICD-10-CM | POA: Insufficient documentation

## 2020-07-05 LAB — POCT PREGNANCY, URINE: Preg Test, Ur: NEGATIVE

## 2020-07-05 NOTE — MAU Note (Signed)
.   Karen Spence is a 19 y.o. at Unknown here in MAU reporting: that she started having heavy vaginal bleeding on Friday with lower abdominal cramping LMP: 06/08/20 Onset of complaint: since friday Pain score: 5 Vitals:   07/05/20 1237  BP: (!) 113/59  Pulse: 76  Resp: 16  Temp: 98.1 F (36.7 C)     FHT: Lab orders placed from triage: UPT

## 2020-07-05 NOTE — MAU Note (Signed)
Pt sent to lobby until she can provide a urine sample

## 2020-07-05 NOTE — MAU Provider Note (Signed)
First Provider Initiated Contact with Patient 07/05/20 1301      S Ms. Karen Spence is a 19 y.o. G38P2001 non-pregnant female who presents to MAU today with complaint of vaginal bleeding and cramping. She had a negative UPT last week and another one in MAU. She is concerned because her mother told her she must be pregnant and miscarrying. Her LMP was 06/08/20.   O BP (!) 113/59   Pulse 76   Temp 98.1 F (36.7 C) (Oral)   Resp 16   Ht 5\' 2"  (1.575 m)   Wt 117 lb (53.1 kg)   LMP 06/08/2020   BMI 21.40 kg/m  Physical Exam Constitutional:      General: She is not in acute distress.    Appearance: Normal appearance. She is normal weight.  HENT:     Mouth/Throat:     Mouth: Mucous membranes are moist.  Cardiovascular:     Rate and Rhythm: Normal rate and regular rhythm.     Pulses: Normal pulses.  Pulmonary:     Effort: Pulmonary effort is normal.  Musculoskeletal:     Cervical back: Normal range of motion.  Skin:    General: Skin is warm.  Neurological:     Mental Status: She is alert.  Psychiatric:        Mood and Affect: Mood normal.        Behavior: Behavior normal.        Thought Content: Thought content normal.        Judgment: Judgment normal.     A Non pregnant female Medical screening exam complete Bleeding related to regular menstrual cycle  P Discharge from MAU in stable condition Patient given the option of transfer to [facility] for further evaluation or seek care in outpatient facility of choice List of options for follow-up given  Birth control counseling given Warning signs for worsening condition that would warrant emergency follow-up discussed Patient may return to MAU as needed for pregnancy related complaints  Gabriel Carina, CNM 07/05/2020 1:08 PM

## 2020-11-07 IMAGING — DX DG CHEST 1V PORT
1 series · 1 of 1 positions shown · non-contrast
Comparison: None.

CLINICAL DATA: Shortness of breath

EXAM:
PORTABLE CHEST 1 VIEW

[chest]
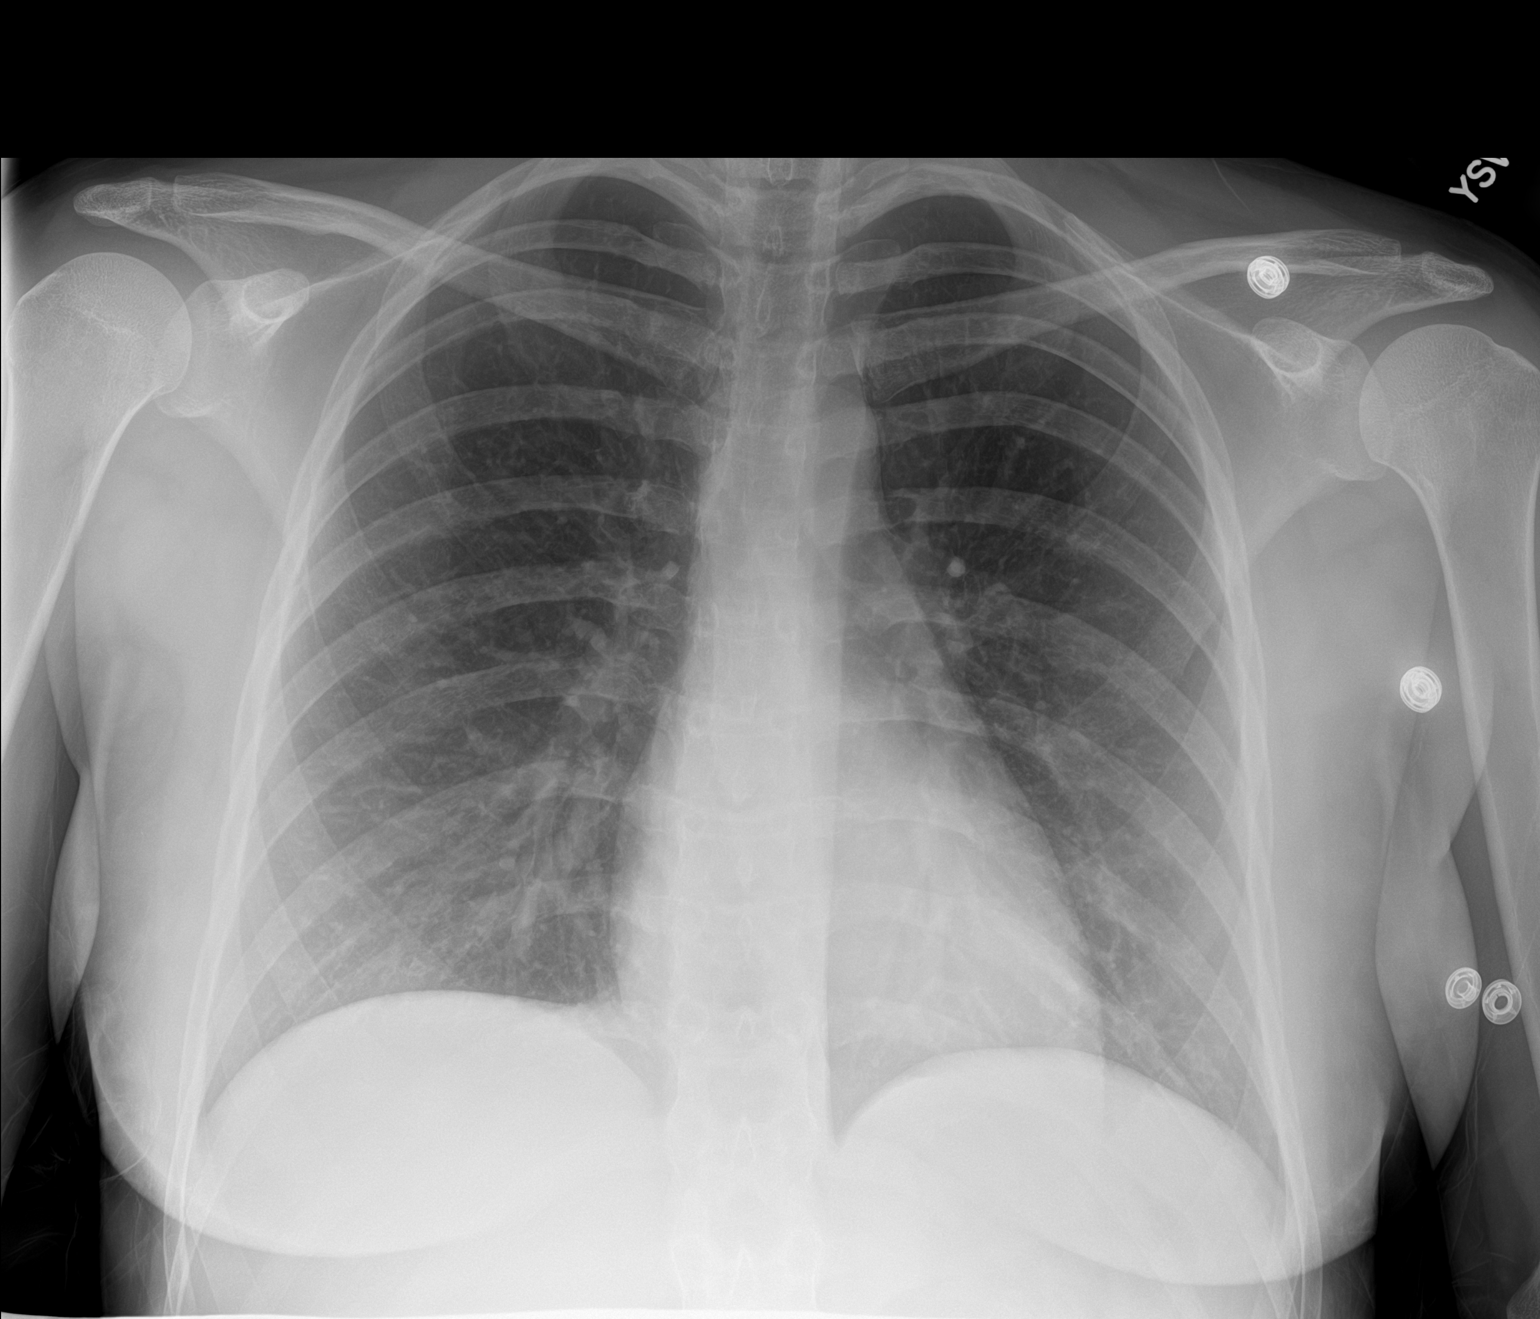

[1 of 1 positions shown; findings below may reference images not displayed]

FINDINGS: The heart size and mediastinal contours are within normal limits.
Both lungs are clear. The visualized skeletal structures are
unremarkable.
IMPRESSION: No active disease.

## 2021-07-27 ENCOUNTER — Other Ambulatory Visit: Payer: Self-pay

## 2021-07-27 ENCOUNTER — Ambulatory Visit (HOSPITAL_COMMUNITY)
Admission: EM | Admit: 2021-07-27 | Discharge: 2021-07-27 | Discharge status: Against Medical Advice | Payer: Medicaid Other

## 2021-07-27 NOTE — ED Triage Notes (Deleted)
Pt states she might have been bite by a spider. Has bump to RUQ (abd).

## 2022-06-13 NOTE — Progress Notes (Unsigned)
  Subjective:    Karen Spence - 21 y.o. female MRN [id number]  Date of birth: 2001-08-04  HPI  [name] is to establish care.   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 She has never used smokeless tobacco. She reports that she does not currently use alcohol. She reports that she does not currently use drugs.   Family History  family history includes COPD in her maternal grandfather; Diabetes in her father and maternal grandmother; Hypertension in her father, maternal grandmother, mother, and paternal grandfather; Kidney failure in her maternal grandmother; Lung cancer in her paternal grandfather.   Medications: reviewed and updated   Objective:   Physical Exam There were no vitals taken for this visit. Physical Exam      Assessment & Plan:         Patient was given clear instructions to go to Emergency Department or return to medical center if symptoms don't improve, worsen, or new problems develop.The patient verbalized understanding.  I discussed the assessment and treatment plan with the patient. The patient was provided an opportunity to ask questions and all were answered. The patient agreed with the plan and demonstrated an understanding of the instructions.    The patient was advised to call back or seek an in-person evaluation if the symptoms worsen or if the condition fails to improve as anticipated.    Durene Fruits, NP 06/13/2022, 8:06 AM Primary Care at [facility]

## 2022-06-19 ENCOUNTER — Encounter: Payer: Medicaid Other | Admitting: Family

## 2022-06-19 DIAGNOSIS — Z7689 Persons encountering health services in other specified circumstances: Secondary | ICD-10-CM

## 2022-07-17 NOTE — Progress Notes (Signed)
Erroneous encounter-disregard

## 2022-07-24 ENCOUNTER — Encounter: Payer: Medicaid Other | Admitting: Family

## 2022-07-24 DIAGNOSIS — Z7689 Persons encountering health services in other specified circumstances: Secondary | ICD-10-CM

## 2023-07-13 ENCOUNTER — Encounter: Payer: Self-pay | Admitting: Emergency Medicine

## 2023-07-13 ENCOUNTER — Other Ambulatory Visit: Payer: Self-pay

## 2023-07-13 ENCOUNTER — Emergency Department
Admission: EM | Admit: 2023-07-13 | Discharge: 2023-07-13 | Disposition: A | Discharge status: Home / Self Care | Payer: Medicaid Other | Attending: Emergency Medicine | Admitting: Emergency Medicine

## 2023-07-13 DIAGNOSIS — S99922A Unspecified injury of left foot, initial encounter: Secondary | ICD-10-CM | POA: Diagnosis present

## 2023-07-13 DIAGNOSIS — Z23 Encounter for immunization: Secondary | ICD-10-CM | POA: Insufficient documentation

## 2023-07-13 DIAGNOSIS — W450XXA Nail entering through skin, initial encounter: Secondary | ICD-10-CM | POA: Diagnosis not present

## 2023-07-13 DIAGNOSIS — S91332A Puncture wound without foreign body, left foot, initial encounter: Secondary | ICD-10-CM | POA: Diagnosis not present

## 2023-07-13 DIAGNOSIS — L089 Local infection of the skin and subcutaneous tissue, unspecified: Secondary | ICD-10-CM

## 2023-07-13 MED ORDER — CIPROFLOXACIN HCL 500 MG PO TABS: 500.0000 mg | ORAL_TABLET | Freq: Two times a day (BID) | ORAL | 0 refills | Status: AC

## 2023-07-13 MED ORDER — TETANUS-DIPHTH-ACELL PERTUSSIS 5-2.5-18.5 LF-MCG/0.5 IM SUSY
0.5000 mL | PREFILLED_SYRINGE | Freq: Once | INTRAMUSCULAR | Status: AC
  Administered 2023-07-13: 0.5 mL via INTRAMUSCULAR

## 2023-07-13 NOTE — Discharge Instructions (Signed)
please take your antibiotic for next 5 days as written.  Return to ED for any signs of infection, such as redness, swelling, fever, or pus.

## 2023-07-13 NOTE — ED Provider Notes (Addendum)
   [facility] Provider Note    Event Date/Time   First MD Initiated Contact with Patient 07/13/23 1147     (approximate)  History   Chief Complaint: Foot Pain  HPI  Karen Spence is a 22 y.o. female  with no pmhx, p/w a puncture wound to left foot.  Pt states she stepped on a nail sticking out of the carpet yesterday with barefeet.  Pt washed it out well and bandaged.  Was told today by a coworker that it could give her tetanus and she needed to go get a td shot.  Unknown last td.   No swelling, no significant pain. no fever.    Physical Exam   Triage Vital Signs: ED Triage Vitals  Encounter Vitals Group     BP 07/13/23 1052 110/81     Systolic BP Percentile --      Diastolic BP Percentile --      Pulse Rate 07/13/23 1052 96     Resp 07/13/23 1052 16     Temp 07/13/23 1052 98.6 F (37 C)     Temp Source 07/13/23 1052 Oral     SpO2 07/13/23 1052 97 %     Weight 07/13/23 1053 130 lb (59 kg)     Height 07/13/23 1053 5\' 2"  (1.575 m)     Head Circumference --      Peak Flow --      Pain Score 07/13/23 1104 5     Pain Loc --      Pain Education --      Exclude from Growth Chart --     Most recent vital signs: Vitals:   07/13/23 1052  BP: 110/81  Pulse: 96  Resp: 16  Temp: 98.6 F (37 C)  SpO2: 97%    General: Awake, no distress.  CV:  Good peripheral perfusion.   Resp:  Normal effort.   Abd:  No distention.  Other:  pt with small wound to distal plantar aspect of left foot.  Appears consistent with a superfical puncture wound (that went in approx 1/2" but remained superficial just under the skin).  No sign of infection.     ED Results / Procedures / Treatments   MEDICATIONS ORDERED IN ED: Medications - No data to display   IMPRESSION / MDM / ASSESSMENT AND PLAN / ED COURSE  I reviewed the triage vital signs and the nursing notes.  Patient's presentation is most consistent with acute illness / injury with system  symptoms.  pt with puncture wound from a nail to left foot.  We will update tdap.  Treat with abx.  No sign of infection currently.   FINAL CLINICAL IMPRESSION(S) / ED DIAGNOSES   puncture wound    Note:  This document was prepared using Dragon voice recognition software and may include unintentional dictation errors.   Minna Antis, MD 07/13/23 1202    Minna Antis, MD 07/13/23 [phone]

## 2023-07-13 NOTE — ED Triage Notes (Signed)
Pt states she stepped on a nail coming out of the carpet yesterday on her left foot. Bleeding controlled. Unknown last tetanus.

## 2023-12-13 ENCOUNTER — Ambulatory Visit (INDEPENDENT_AMBULATORY_CARE_PROVIDER_SITE_OTHER): Payer: Medicaid Other | Admitting: Obstetrics and Gynecology

## 2023-12-13 ENCOUNTER — Encounter: Payer: Self-pay | Admitting: Obstetrics and Gynecology

## 2023-12-13 ENCOUNTER — Other Ambulatory Visit (HOSPITAL_COMMUNITY)
Admission: RE | Admit: 2023-12-13 | Discharge: 2023-12-13 | Disposition: A | Discharge status: Home / Self Care | Payer: Medicaid Other | Source: Ambulatory Visit | Attending: Obstetrics and Gynecology | Admitting: Obstetrics and Gynecology

## 2023-12-13 VITALS — BP 101/65 | HR 60 | Ht 63.0 in | Wt 132.0 lb

## 2023-12-13 DIAGNOSIS — Z113 Encounter for screening for infections with a predominantly sexual mode of transmission: Secondary | ICD-10-CM | POA: Insufficient documentation

## 2023-12-13 DIAGNOSIS — Z01419 Encounter for gynecological examination (general) (routine) without abnormal findings: Secondary | ICD-10-CM | POA: Diagnosis present

## 2023-12-13 DIAGNOSIS — Z975 Presence of (intrauterine) contraceptive device: Secondary | ICD-10-CM | POA: Diagnosis not present

## 2023-12-13 DIAGNOSIS — Z1339 Encounter for screening examination for other mental health and behavioral disorders: Secondary | ICD-10-CM

## 2023-12-13 NOTE — Progress Notes (Addendum)
22 y.o. New GYN presents for AEX/PAP/STD screening.  C/o pea sized, hard bump inside her labia x 6 months, Pt has the IUD for [facility], she requested UPT because she feels pregnant and is having sx.

## 2023-12-13 NOTE — Progress Notes (Signed)
GYNECOLOGY ANNUAL PREVENTATIVE CARE ENCOUNTER NOTE  History:     Karen Spence is a 22 y.o. G40P2001 female here for a routine annual gynecologic exam.  Current complaints: possible vaginal cyst.   Denies abnormal vaginal bleeding, discharge, pelvic pain, problems with intercourse or other gynecologic concerns.    Gynecologic History Patient's last menstrual period was 12/10/2023 (exact date). Contraception: IUD Last Pap: none recorded.  Last mammogram: n/a  Obstetric History OB History  Gravida Para Term Preterm AB Living  2 2 2  0 0 1  SAB IAB Ectopic Multiple Live Births  0 0 0  1    # Outcome Date GA Lbr Len/2nd Weight Sex Type Anes PTL Lv  2 Term 03/16/20 [redacted]w[redacted]d   M Vag-Spont     1 Term 12/26/17 [redacted]w[redacted]d    Vag-Spont   LIV    Past Medical History:  Diagnosis Date   ADHD    Anxiety    Depression    Medical history non-contributory    Pregnancy induced hypertension     Past Surgical History:  Procedure Laterality Date   NO PAST SURGERIES      Current Outpatient Medications on File Prior to Visit  Medication Sig Dispense Refill   acetaminophen (TYLENOL) 325 MG tablet Take 650 mg by mouth every 6 (six) hours as needed for mild pain or headache.     cyclobenzaprine (FLEXERIL) 5 MG tablet Take 1-2 tablets (5-10 mg total) by mouth 3 (three) times daily as needed for muscle spasms. 15 tablet 0   Elastic Bandages & Supports (COMFORT FIT MATERNITY SUPP MED) MISC 1 Device by Does not apply route daily. 1 each 0   No current facility-administered medications on file prior to visit.    No Known Allergies  Social History:  reports that she has never smoked. She uses smokeless tobacco. She reports that she does not currently use alcohol. She reports that she does not currently use drugs.  Family History  Problem Relation Age of Onset   Diabetes Father    Diabetes Maternal Grandmother    Hypertension Father    Hypertension Maternal Grandmother    Kidney failure  Maternal Grandmother    COPD Maternal Grandfather    Hypertension Mother    Hypertension Paternal Grandfather    Lung cancer Paternal Grandfather     The following portions of the patient's history were reviewed and updated as appropriate: allergies, current medications, past family history, past medical history, past social history, past surgical history and problem list.  Review of Systems Pertinent items noted in HPI and remainder of comprehensive ROS otherwise negative.  Physical Exam:  BP 101/65   Pulse 60   Ht 5\' 3"  (1.6 m)   Wt 132 lb (59.9 kg)   LMP 12/10/2023 (Exact Date)   BMI 23.38 kg/m  CONSTITUTIONAL: Well-developed, well-nourished female in no acute distress.  HENT:  Normocephalic, atraumatic, External right and left ear normal. Oropharynx is clear and moist EYES: Conjunctivae and EOM are normal.  NECK: Normal range of motion, supple, no masses.  Normal thyroid.  SKIN: Skin is warm and dry. No rash noted. Not diaphoretic. No erythema. No pallor. MUSCULOSKELETAL: Normal range of motion. No tenderness.  No cyanosis, clubbing, or edema.  2+ distal pulses. NEUROLOGIC: Alert and oriented to person, place, and time. Normal reflexes, muscle tone coordination.  PSYCHIATRIC: Normal mood and affect. Normal behavior. Normal judgment and thought content. CARDIOVASCULAR: Normal heart rate noted, regular rhythm RESPIRATORY: Clear to auscultation bilaterally. Effort  and breath sounds normal, no problems with respiration noted. BREASTS: Symmetric in size. No masses, tenderness, skin changes, nipple drainage, or lymphadenopathy bilaterally. Performed in the presence of a chaperone. ABDOMEN: Soft, no distention noted.  No tenderness, rebound or guarding.  PELVIC: Normal appearing external genitalia and urethral meatus; normal appearing vaginal mucosa and cervix.  No abnormal discharge noted.  Pap smear obtained. Vaginal swab taken.   Normal uterine size, no other palpable masses, no  uterine or adnexal tenderness. IUD strings easily seen.  Performed in the presence of a chaperone.   Assessment and Plan:    1. Women's annual routine gynecological examination [Z01.419] (Primary) Normal annual exam  - Cytology - PAP( [place])  2. Routine screening for STI (sexually transmitted infection) Per pt request  - Cervicovaginal ancillary only( [place]) - HIV antibody (with reflex) - Hepatitis B Surface AntiGEN - Hepatitis C Antibody - RPR  3. IUD (intrauterine device) in place   Will follow up results of pap smear and manage accordingly. Routine preventative health maintenance measures emphasized. Please refer to After Visit Summary for other counseling recommendations.      Mariel Aloe, MD, FACOG Obstetrician & Gynecologist, [facility] for [facility] [place], [facility]

## 2023-12-14 LAB — CERVICOVAGINAL ANCILLARY ONLY
Bacterial Vaginitis (gardnerella): POSITIVE — AB
Candida Glabrata: NEGATIVE
Candida Vaginitis: NEGATIVE
Chlamydia: NEGATIVE
Comment: NEGATIVE
Comment: NEGATIVE
Comment: NEGATIVE
Comment: NEGATIVE
Comment: NEGATIVE
Comment: NORMAL
Neisseria Gonorrhea: NEGATIVE
Trichomonas: NEGATIVE

## 2023-12-15 LAB — CYTOLOGY - PAP
Comment: NEGATIVE
Diagnosis: UNDETERMINED — AB
High risk HPV: POSITIVE — AB

## 2023-12-15 LAB — RPR: RPR Ser Ql: NONREACTIVE

## 2023-12-15 LAB — HEPATITIS C ANTIBODY: Hep C Virus Ab: NONREACTIVE

## 2023-12-15 LAB — HIV ANTIBODY (ROUTINE TESTING W REFLEX): HIV Screen 4th Generation wRfx: NONREACTIVE

## 2023-12-15 LAB — HEPATITIS B SURFACE ANTIGEN: Hepatitis B Surface Ag: NEGATIVE

## 2023-12-25 ENCOUNTER — Other Ambulatory Visit: Payer: Self-pay | Admitting: *Deleted

## 2023-12-25 MED ORDER — METRONIDAZOLE 500 MG PO TABS: 500.0000 mg | ORAL_TABLET | Freq: Two times a day (BID) | ORAL | 0 refills | Status: DC

## 2023-12-25 NOTE — Progress Notes (Signed)
Flagyl sent for BV.

## 2024-01-22 ENCOUNTER — Encounter: Payer: Medicaid Other | Admitting: Obstetrics and Gynecology

## 2024-01-22 ENCOUNTER — Encounter: Payer: Self-pay | Admitting: Obstetrics and Gynecology

## 2024-01-22 ENCOUNTER — Ambulatory Visit (INDEPENDENT_AMBULATORY_CARE_PROVIDER_SITE_OTHER): Payer: Medicaid Other | Admitting: Obstetrics and Gynecology

## 2024-01-22 VITALS — BP 113/70 | HR 87 | Ht 63.0 in | Wt 132.0 lb

## 2024-01-22 DIAGNOSIS — R87610 Atypical squamous cells of undetermined significance on cytologic smear of cervix (ASC-US): Secondary | ICD-10-CM

## 2024-01-22 DIAGNOSIS — R87810 Cervical high risk human papillomavirus (HPV) DNA test positive: Secondary | ICD-10-CM

## 2024-01-22 LAB — POCT URINE PREGNANCY: Preg Test, Ur: NEGATIVE

## 2024-01-22 NOTE — Progress Notes (Signed)
23 y.o GYN presents for COLPO, +high risk HPV and ASCUS on PAP.  UPT Negative

## 2024-01-22 NOTE — Progress Notes (Signed)
Colposcopy note:  Chart reviewed.  ASCUS pap with high risk HPV noted.  Patient given informed consent, signed copy in the chart, time out was performed.  Placed in lithotomy position. Cervix viewed with speculum and colposcope after application of acetic acid.   Colposcopy adequate?  yes Acetowhite lesions? none Punctation?  none Mosaicism?  none Abnormal vasculature?  none Biopsies?   none ECC?   none  COMMENTS: Patient was given post procedure instructions.  Pt reassured.  Advised pap in 1 year.  Warden Fillers, MD

## 2024-08-04 ENCOUNTER — Encounter: Payer: Self-pay | Admitting: Obstetrics and Gynecology

## 2024-08-04 ENCOUNTER — Ambulatory Visit (INDEPENDENT_AMBULATORY_CARE_PROVIDER_SITE_OTHER): Admitting: Obstetrics and Gynecology

## 2024-08-04 VITALS — BP 109/71 | HR 61 | Ht 63.0 in | Wt 135.0 lb

## 2024-08-04 DIAGNOSIS — Z30432 Encounter for removal of intrauterine contraceptive device: Secondary | ICD-10-CM | POA: Diagnosis not present

## 2024-08-04 NOTE — Progress Notes (Signed)
 23 y.o. GYN presents for IUD removal. C/o irregular periods, she does not wants any BC at this time.

## 2024-08-04 NOTE — Progress Notes (Signed)
    GYNECOLOGY OFFICE PROCEDURE NOTE  Karen Spence is a 23 y.o. G2P2001 here for  IUD removal. No GYN concerns.  Last pap smear was on 12/24 and was ASCUS with high risk HPV.  IUD Removal  Patient identified, informed consent performed, consent signed.  Patient was in the dorsal lithotomy position, normal external genitalia was noted.  A speculum was placed in the patient's vagina, normal discharge was noted, no lesions. The cervix was visualized, no lesions, no abnormal discharge.  The strings of the IUD were grasped and pulled using kelly forceps. The IUD was removed in its entirety.  Patient tolerated the procedure well.    Patient plans on taking a break from contraception for a while.  Routine preventative health maintenance measures emphasized.  Of note pt has an inclusion cyst on the external vagina at 2 oclock. She may want removal in a few months.  Unsure if in office or OR procedure.  Pt needs repap in 5-6 months. Jerilynn Buddle, MD, FACOG Obstetrician & Gynecologist, [facility] for [facility], [facility]

## 2024-08-27 ENCOUNTER — Encounter (HOSPITAL_BASED_OUTPATIENT_CLINIC_OR_DEPARTMENT_OTHER): Payer: Self-pay | Admitting: Family Medicine

## 2024-08-27 ENCOUNTER — Ambulatory Visit (INDEPENDENT_AMBULATORY_CARE_PROVIDER_SITE_OTHER): Admitting: Family Medicine

## 2024-08-27 VITALS — BP 104/62 | HR 81 | Ht 63.0 in | Wt 134.2 lb

## 2024-08-27 DIAGNOSIS — F909 Attention-deficit hyperactivity disorder, unspecified type: Secondary | ICD-10-CM | POA: Diagnosis not present

## 2024-08-27 DIAGNOSIS — F419 Anxiety disorder, unspecified: Secondary | ICD-10-CM | POA: Insufficient documentation

## 2024-08-27 DIAGNOSIS — Z0000 Encounter for general adult medical examination without abnormal findings: Secondary | ICD-10-CM

## 2024-08-27 MED ORDER — ESCITALOPRAM OXALATE 5 MG PO TABS: 5.0000 mg | ORAL_TABLET | Freq: Every day | ORAL | 1 refills | Status: AC

## 2024-08-27 MED ORDER — AMPHETAMINE-DEXTROAMPHET ER 15 MG PO CP24: 15.0000 mg | ORAL_CAPSULE | ORAL | 0 refills | Status: AC

## 2024-08-27 NOTE — Progress Notes (Signed)
 New Patient Office Visit  Subjective   Patient ID: Karen Spence, female    DOB: 2001/04/27  Age: 23 y.o. MRN: [id number]  CC:  Chief Complaint  Patient presents with   New Patient (Initial Visit)    New Patient would like to discuss ADHD medication was on before but has not been on this for a few years was taking adderall    HPI Karen Spence presents to establish care Last PCP - Josette Aho [facility] [place]  Anxiety: prescribed Zoloft in the past. Has been feeling somewhat anxious more recently - GAD 7 with score 14. Was seeing therapist previously, found benefit with this, wanting to establish with one.  ADHD: took Vyvanse in high school, recalls Concerta when in elementary school. Prescribed Adderall through most recent PCP. Was taking shorter acting form, uncertain benefit with this.  Patient is originally [place]. Patient works as a Child psychotherapist. She enjoys cooking and baking, went to school for hair - working on getting her license. Has two kids.  Outpatient Encounter Medications as of 08/27/2024  Medication Sig   amphetamine -dextroamphetamine (ADDERALL XR) 15 MG 24 hr capsule Take 1 capsule by mouth every morning.   escitalopram  (LEXAPRO ) 5 MG tablet Take 1 tablet (5 mg total) by mouth daily.   [DISCONTINUED] acetaminophen  (TYLENOL ) 325 MG tablet Take 650 mg by mouth every 6 (six) hours as needed for mild pain or headache. (Patient not taking: Reported on 08/04/2024)   [DISCONTINUED] cyclobenzaprine  (FLEXERIL ) 5 MG tablet Take 1-2 tablets (5-10 mg total) by mouth 3 (three) times daily as needed for muscle spasms. (Patient not taking: Reported on 08/04/2024)   [DISCONTINUED] Elastic Bandages & Supports (COMFORT FIT MATERNITY SUPP MED) MISC 1 Device by Does not apply route daily. (Patient not taking: Reported on 08/04/2024)   [DISCONTINUED] metroNIDAZOLE  (FLAGYL ) 500 MG tablet Take 1 tablet (500 mg total) by mouth 2 (two) times daily. (Patient not taking:  Reported on 08/04/2024)   No facility-administered encounter medications on file as of 08/27/2024.    Past Medical History:  Diagnosis Date   ADHD    Anxiety    Depression    Medical history non-contributory    Pregnancy induced hypertension     Past Surgical History:  Procedure Laterality Date   NO PAST SURGERIES      Family History  Problem Relation Age of Onset   Diabetes Father    Diabetes Maternal Grandmother    Hypertension Father    Hypertension Maternal Grandmother    Kidney failure Maternal Grandmother    COPD Maternal Grandfather    Hypertension Mother    Hypertension Paternal Grandfather    Lung cancer Paternal Grandfather     Social History   Socioeconomic History   Marital status: Single    Spouse name: Not on file   Number of children: 1   Years of education: Not on file   Highest education level: Not on file  Occupational History   Not on file  Tobacco Use   Smoking status: Never    Passive exposure: Never   Smokeless tobacco: Current   Tobacco comments:    once or twice she states  Vaping Use   Vaping status: Every Day  Substance and Sexual Activity   Alcohol use: Not Currently   Drug use: Not Currently   Sexual activity: Yes    Birth control/protection: None  Other Topics Concern   Not on file  Social History Narrative   ** Merged History Encounter **  Social Drivers of Corporate investment banker Strain: Not on file  Food Insecurity: No Food Insecurity (03/16/2020)   Received from [facility] visits prior to 02/24/2023.   Hunger Vital Sign    Within the past 12 months, you worried that your food would run out before you got the money to buy more.: Never true    Within the past 12 months, the food you bought just didn't last and you didn't have money to get more.: Never true  Transportation Needs: Not on file  Physical Activity: Not on file  Stress: Not on file  Social Connections: Not on file  Intimate  Partner Violence: Not on file    Objective   BP 104/62 (BP Location: Right Arm, Patient Position: Sitting, Cuff Size: Normal)   Pulse 81   Ht 5' 3 (1.6 m)   Wt 134 lb 3.2 oz (60.9 kg)   LMP 07/25/2024 (Approximate)   SpO2 98%   BMI 23.77 kg/m   Physical Exam  23 year old female in no acute distress Cardiovascular exam with regular rate and rhythm Lungs clear to auscultation bilaterally  Assessment & Plan:   Anxiety Assessment & Plan: We discussed options.  She did not note significant improvement with Zoloft when used in the past.  Discussed alternatives and we will start with low-dose of escitalopram .  Will plan to follow-up in about 2 weeks to assess progress.  Considerations at that time include switching to alternative, continue the same dose or with slight titration of dose of medication pending progress. Patient also interested in resuming therapy, referral placed today  Orders: -     Ambulatory referral to Behavioral Health  Attention deficit hyperactivity disorder (ADHD), unspecified ADHD type Assessment & Plan: Discussed options related to medication management.  We can start with Adderall, however utilizing long-acting form.  Will start with relatively low dose and monitor progress with this.  Can consider dose titration pending progress  Orders: -     Ambulatory referral to Behavioral Health  Wellness examination -     CBC with Differential/Platelet; Future -     Comprehensive metabolic panel with GFR; Future -     Lipid panel; Future -     TSH Rfx on Abnormal to Free T4; Future  Other orders -     Escitalopram  Oxalate; Take 1 tablet (5 mg total) by mouth daily.  Dispense: 30 tablet; Refill: 1 -     Amphetamine -Dextroamphet ER; Take 1 capsule by mouth every morning.  Dispense: 30 capsule; Refill: 0  Return in about 2 weeks (around 09/10/2024) for med check, can be virtual.    ___________________________________________ Zeta Bucy de [place], MD, ABFM,  CAQSM Primary Care and Sports Medicine [facility]

## 2024-08-27 NOTE — Assessment & Plan Note (Signed)
 Discussed options related to medication management.  We can start with Adderall, however utilizing long-acting form.  Will start with relatively low dose and monitor progress with this.  Can consider dose titration pending progress

## 2024-08-27 NOTE — Patient Instructions (Signed)
  Medication Instructions:  Your physician recommends that you continue on your current medications as directed. Please refer to the Current Medication list given to you today. --If you need a refill on any your medications before your next appointment, please call your pharmacy first. If no refills are authorized on file call the office.-- Lab Work: Your physician has recommended that you have lab work today: 1 week before physical appointment If you have labs (blood work) drawn today and your tests are completely normal, you will receive your results via MyChart message OR a phone call from our staff.  Please ensure you check your voicemail in the event that you authorized detailed messages to be left on a delegated number. If you have any lab test that is abnormal or we need to change your treatment, we will call you to review the results.  Follow-Up: Your next appointment:   Your physician recommends that you schedule a follow-up appointment in: 2 week follow up can be virtual, 6-8 week physical  with Dr. de [place]  You will receive a text message or e-mail with a link to a survey about your care and experience with us  today! We would greatly appreciate your feedback!   Thanks for letting us  be apart of your health journey!!  Primary Care and Sports Medicine   Dr. Quintin sheerer Peru   We encourage you to activate your patient portal called MyChart.  Sign up information is provided on this After Visit Summary.  MyChart is used to connect with patients for Virtual Visits (Telemedicine).  Patients are able to view lab/test results, encounter notes, upcoming appointments, etc.  Non-urgent messages can be sent to your provider as well. To learn more about what you can do with MyChart, please visit --  [email or web address].

## 2024-08-27 NOTE — Assessment & Plan Note (Addendum)
 We discussed options.  She did not note significant improvement with Zoloft when used in the past.  Discussed alternatives and we will start with low-dose of escitalopram .  Will plan to follow-up in about 2 weeks to assess progress.  Considerations at that time include switching to alternative, continue the same dose or with slight titration of dose of medication pending progress. Patient also interested in resuming therapy, referral placed today

## 2024-09-17 NOTE — Progress Notes (Signed)
 Subjective:   Patient Active Problem List   Diagnosis Date Noted  . Postpartum exam 04/27/2020  . 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 Patient born of twin pregnancy 06/22/2018    Overview:  5-6 weeks premature was in NICU.      [name] Attention deficit hyperactivity disorder (ADHD) 10/22/2015  . Body mass index (BMI) pediatric, 5th percentile to less than 85th percentile for age 24/28/2016  . Deliberate self-cutting 10/22/2015  . Family problems 10/22/2015     Chief Complaint  Patient presents with  . Nasal Congestion    Congestion, headache, fatigue, cough, pt states pain in her chest with deep breath and shortness of breath with coughing started yesterday. No known fever. Taken tylenol  and motrin  as needed.      History of Present Illness The patient is a 23 year old female who presents for evaluation of cough.  She reports experiencing congestion, rhinorrhea, headaches, and fatigue on-going for one day. She does not have a fever or body aches. Her most bothersome symptoms are sinus facial congestion. She had a sore throat the previous night, but it is not currently present. She has been managing her symptoms with ibuprofen  and Tylenol . Her boyfriend was ill a few days ago with similar symptoms. She does not have asthma and does not smoke. Her appetite and hydration are normal.  She denies nausea vomiting or diarrhea.  She states she needs a note for work today which prompted her evaluation at urgent care.   Parts of patient history reviewed include PMH, problem list, medications, allergies, and social history.  Objective:   Vitals:   09/17/24 1524  BP: 111/71  Pulse: 97  Resp: 18  Temp: 98.8 F (37.1 C)  TempSrc: Oral  SpO2: 98%  Weight: 61.2 kg (135 lb)    Physical Exam Vitals and nursing note reviewed.  Constitutional:      General: She is not in acute distress.    Appearance: Normal appearance. She is ill-appearing. She is not toxic-appearing.   HENT:     Head: Normocephalic and atraumatic.     Jaw: No trismus, swelling or pain on movement.     Right Ear: Tympanic membrane and ear canal normal.     Left Ear: Tympanic membrane and ear canal normal.     Nose: Congestion and rhinorrhea present. Rhinorrhea is clear.     Right Sinus: No maxillary sinus tenderness or frontal sinus tenderness.     Left Sinus: No maxillary sinus tenderness or frontal sinus tenderness.     Mouth/Throat:     Pharynx: Oropharynx is clear. Uvula midline. Postnasal drip present. No oropharyngeal exudate or uvula swelling.     Tonsils: No tonsillar exudate.  Eyes:     Extraocular Movements: Extraocular movements intact.     Conjunctiva/sclera: Conjunctivae normal.     Pupils: Pupils are equal, round, and reactive to light.  Cardiovascular:     Rate and Rhythm: Normal rate and regular rhythm.     Pulses: Normal pulses.     Heart sounds: Normal heart sounds.  Pulmonary:     Effort: Pulmonary effort is normal. No respiratory distress.     Breath sounds: Normal breath sounds. No wheezing.  Abdominal:     Palpations: Abdomen is soft.     Tenderness: There is no abdominal tenderness.  Musculoskeletal:     Cervical back: Neck supple.  Lymphadenopathy:     Cervical: No cervical adenopathy.  Skin:    General: Skin is warm.  Capillary Refill: Capillary refill takes less than 2 seconds.  Neurological:     General: No focal deficit present.     Mental Status: She is alert and oriented to person, place, and time. Mental status is at baseline.     Results for orders placed or performed in visit on 09/17/24  POC SARS-COV-2 SYMPTOMATIC (IDNOW)   Collection Time: 09/17/24  3:52 PM  Result Value Ref Range   SARS-COV-2 IDNOW Negative Negative   SARS IDNOW Information      A rapid, molecular diagnostic test on the IDNOW.  Negative results should be treated as presumptive and, if inconsistent with clinical symptoms should be confirmed with an alternative  molecular assay.   Lab work reviewed and incorporated into the decision making process    Assessment/Plan:   Assessment & Plan  URI  Acute Cough   This patient presents with symptoms suspicious for likely viral upper respiratory infection. Based on history and physical doubt bacterial sinusitis s/s <7-10days. COVID negative.  Rapid strep and culture not indicated based on Centor criteria.  Airway is patent.  Normal phonation.  Doubt RPA, PTA, Ludwig's angina. Do not suspect underlying cardiopulmonary process.  Patient afebrile, not tachycardic, no hypoxia.  No adventitious lung sounds.  Unlikely pneumonia.  No underlying cardiopulmonary disease including COPD/asthma.. Patient is nontoxic appearing and not in need of emergent medical intervention. Patient told to self isolate at home until symptoms subside for 72 hours.  Work note provided to the patient.  Red flags warranting reevaluation advised.  Patient verbalized understanding.   Home Care   Patient has been instructed on medications, dosages, side effects, and possible interactions as associated with each diagnosis in my impression and plan above. Patient education (verbal/handout) given on diagnosis, pathophysiology, treatment of diagnosis, side effects of medication use for treatment, restrictions while taking medication, and supportive measures.   Patient was instructed on when to follow up and know that they can follow up here, with their PCP, Urgent Care, ED.  They have been instructed that if symptoms worsen that should return to the clinic, go to the nearest ED, or activate EMS. Red Flags associated with their diagnoses were reviewed and patient was educated on what to do if red.       Patient agreed with plan and voiced understanding.  No barriers to adherence perceived by myself.  Portions of this note may have been dictated using Dragon dictation software/hardware and may contain grammatical or spelling errors.   Electronically  signed by:   Sotero Pore, DNP ENP-C FNP-C [facility]  09/17/2024 3:56 PM

## 2024-09-18 ENCOUNTER — Encounter: Payer: Self-pay | Admitting: Physician Assistant

## 2024-09-18 ENCOUNTER — Ambulatory Visit (INDEPENDENT_AMBULATORY_CARE_PROVIDER_SITE_OTHER): Admitting: Physician Assistant

## 2024-09-18 VITALS — BP 109/69 | HR 80 | Wt 135.7 lb

## 2024-09-18 DIAGNOSIS — Z30430 Encounter for insertion of intrauterine contraceptive device: Secondary | ICD-10-CM | POA: Diagnosis not present

## 2024-09-18 DIAGNOSIS — Z3202 Encounter for pregnancy test, result negative: Secondary | ICD-10-CM

## 2024-09-18 LAB — POCT URINE PREGNANCY: Preg Test, Ur: NEGATIVE

## 2024-09-18 MED ORDER — LIDOCAINE HCL URETHRAL/MUCOSAL 2 % EX GEL
1.0000 | Freq: Once | CUTANEOUS | Status: AC
  Administered 2024-09-18: 1

## 2024-09-18 MED ORDER — LEVONORGESTREL 20 MCG/DAY IU IUD
1.0000 | INTRAUTERINE_SYSTEM | Freq: Once | INTRAUTERINE | Status: AC
  Administered 2024-09-18: 1 via INTRAUTERINE

## 2024-09-18 NOTE — Progress Notes (Signed)
   GYNECOLOGY CLINIC PROCEDURE NOTE  Karen Spence is a 23 y.o. G2P2001 here for Mirena  IUD insertion. No GYN concerns.  Last pap smear was on 12/24 and was ASCUS HPV+.  IUD Insertion Procedure Note Patient identified, informed consent performed, consent signed.   Discussed risks of irregular bleeding, cramping, infection, malpositioning or misplacement of the IUD outside the uterus which may require further procedure such as laparoscopy. Time out was performed.  Urine pregnancy test negative, advised to take another in two weeks since recent unprotected intercourse.  Speculum placed in the vagina.  Cervix visualized.  Cleaned with Betadine x 2.  Grasped anteriorly with a single tooth tenaculum.  Uterus sounded to 9 cm.  Mirena  IUD placed per manufacturer's recommendations.  Strings trimmed to 3 cm. Tenaculum was removed, good hemostasis noted.  Patient tolerated procedure well.   Patient was given post-procedure instructions.  She was advised to have backup contraception for one week.  Patient was also asked to check IUD strings periodically and follow up in 4 weeks for IUD check.   [place], PA-C 09/18/24

## 2024-09-18 NOTE — Progress Notes (Signed)
 Patient presents for IUD insertion, patient reports unprotected intercourse on 09/19 snd 09/20. Patient reports LMP: 09/22. UPT negative.

## 2024-09-18 NOTE — Patient Instructions (Signed)
  IUD aftercare instructions  1. Uterine cramping is common after IUD placement. You can help relieve the discomfort  with heating pads, Tylenol  (acetaminophen ), Aspirin or Advil  (ibuprofen ). If your  cramping becomes very painful, please call the clinic.   2. Irregular bleeding and spotting is normal for the first few months after the IUD is placed.  In some cases, women may experience irregular bleeding or spotting for up to six months  after the IUD is placed. This bleeding can be annoying at first but usually will become  lighter with the Mirena  IUD quickly. Call the clinic if your bleeding is excessive and not  getting better.   3. Your period will likely be shorter and lighter with a Mirena  IUD. Approximately 40% of  women will stop having periods altogether with the Mirena  IUD. Your period may be  heavier and longer with the Paragard IUD.   4. IUDs do not protect against sexually transmitted infections including the AIDS virus  (HIV), warts (HPV), gonorrhea, Chlamydia, and herpes. Condoms should be used to  decrease the risk sexually transmitted infections. If you think that you have been exposed  to a sexually transmitted infection, please call the clinic.   5. If you had the IUD placed for birth control, the Paragard IUD is effective immediately.  The Mirena  IUD is effective immediately if it was inserted within seven days after the  start of your period. If you have Mirena  inserted at any other time during your menstrual  cycle, use another method of birth control, like condoms for at least 7 days.   6. It is possible for the IUD to come out of the uterus. If it does slip out of place, it is most  likely to happen in the first few months after being put in. To make sure your IUD is in  place, you can feel for the IUD strings between periods. To check for strings, wash your  hands. Then, sit or squat down. Place one finger into your vagina until you feel your  cervix. It will  feel hard and rubbery, like the end of your nose. The string ends should be  coming through your cervix. Do not pull on the strings. If the strings feel much longer  than before, if you feel the hard plastic part of the IUD, or if you cannot feel the strings at  all, the IUD may have moved out of place. Please call the clinic and consider using a  back up form of birth control until you are seen.   7. Keep your follow-up appointment for 4-6 weeks after the IUD has been placed.   8. Pregnancy is unlikely after IUD placement, but can happen. If you have early pregnancy  symptoms like nausea and vomiting, breast tenderness, frequent urination or abdominal  pain, you can take a pregnancy test. Please call the clinic if you have any concerns or if  your pregnancy test is positive.   9. The IUD should only be removed by a healthcare provider.  a. The Mirena  IUD should be removed and/or replaced after 5 years.  b. The Paragard IUD should be removed and/or replaced after 10 years.   Warning Signs  Call the clinic if any of the following occurs:   Severe abdominal pain or cramping   Unusual bleeding   Fever or chills   Foul smelling vaginal discharge   Painful intercourse   Positive pregnancy test.

## 2024-09-18 NOTE — Addendum Note (Signed)
 Addended by: MARCINE GAINS on: 09/18/2024 11:28 AM   Modules accepted: Orders

## 2024-10-07 ENCOUNTER — Telehealth (HOSPITAL_BASED_OUTPATIENT_CLINIC_OR_DEPARTMENT_OTHER): Admitting: Family Medicine

## 2024-10-16 ENCOUNTER — Ambulatory Visit: Admitting: Obstetrics and Gynecology

## 2024-10-23 ENCOUNTER — Encounter (HOSPITAL_BASED_OUTPATIENT_CLINIC_OR_DEPARTMENT_OTHER): Admitting: Family Medicine

## 2024-11-01 ENCOUNTER — Emergency Department (HOSPITAL_COMMUNITY)
Admission: EM | Admit: 2024-11-01 | Discharge: 2024-11-02 | Disposition: A | Discharge status: Home / Self Care | Attending: Emergency Medicine | Admitting: Emergency Medicine

## 2024-11-01 ENCOUNTER — Emergency Department (HOSPITAL_COMMUNITY)

## 2024-11-01 DIAGNOSIS — R509 Fever, unspecified: Secondary | ICD-10-CM | POA: Diagnosis not present

## 2024-11-01 DIAGNOSIS — S022XXA Fracture of nasal bones, initial encounter for closed fracture: Secondary | ICD-10-CM | POA: Insufficient documentation

## 2024-11-01 DIAGNOSIS — S0181XA Laceration without foreign body of other part of head, initial encounter: Secondary | ICD-10-CM

## 2024-11-01 DIAGNOSIS — S0990XA Unspecified injury of head, initial encounter: Secondary | ICD-10-CM | POA: Diagnosis present

## 2024-11-01 DIAGNOSIS — Y92410 Unspecified street and highway as the place of occurrence of the external cause: Secondary | ICD-10-CM | POA: Insufficient documentation

## 2024-11-01 MED ORDER — ACETAMINOPHEN 500 MG PO TABS
1000.0000 mg | ORAL_TABLET | Freq: Once | ORAL | Status: AC
  Administered 2024-11-01: 1000 mg via ORAL
  Filled 2024-11-01: qty 2
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
## 2024-11-02 ENCOUNTER — Emergency Department (HOSPITAL_COMMUNITY)

## 2024-11-02 LAB — RESP PANEL BY RT-PCR (RSV, FLU A&B, COVID)  RVPGX2
Influenza A by PCR: NEGATIVE
Influenza B by PCR: NEGATIVE
Resp Syncytial Virus by PCR: NEGATIVE
SARS Coronavirus 2 by RT PCR: NEGATIVE

## 2024-11-02 LAB — POC URINE PREG, ED: Preg Test, Ur: NEGATIVE

## 2024-11-02 MED ORDER — NAPROXEN 500 MG PO TABS: 500.0000 mg | ORAL_TABLET | Freq: Two times a day (BID) | ORAL | 0 refills | Status: AC
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
## 2024-11-03 ENCOUNTER — Ambulatory Visit: Payer: Self-pay
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
## 2024-11-03 NOTE — Telephone Encounter (Signed)
 Pt has an appt with ENT 11/11. Routing all to Dr. De [place] for review.

## 2024-11-04 ENCOUNTER — Institutional Professional Consult (permissible substitution) (INDEPENDENT_AMBULATORY_CARE_PROVIDER_SITE_OTHER): Admitting: Otolaryngology

## 2024-11-04 ENCOUNTER — Other Ambulatory Visit: Payer: Self-pay

## 2024-11-04 ENCOUNTER — Ambulatory Visit: Payer: Self-pay | Admitting: *Deleted

## 2024-11-04 ENCOUNTER — Encounter (HOSPITAL_BASED_OUTPATIENT_CLINIC_OR_DEPARTMENT_OTHER): Payer: Self-pay

## 2024-11-04 ENCOUNTER — Telehealth (HOSPITAL_BASED_OUTPATIENT_CLINIC_OR_DEPARTMENT_OTHER): Payer: Self-pay | Admitting: Family Medicine

## 2024-11-04 ENCOUNTER — Emergency Department (HOSPITAL_COMMUNITY)
Admission: EM | Admit: 2024-11-04 | Discharge: 2024-11-04 | Disposition: A | Discharge status: Home / Self Care | Source: Ambulatory Visit | Attending: Emergency Medicine | Admitting: Emergency Medicine

## 2024-11-04 DIAGNOSIS — M542 Cervicalgia: Secondary | ICD-10-CM | POA: Diagnosis present

## 2024-11-04 DIAGNOSIS — I10 Essential (primary) hypertension: Secondary | ICD-10-CM | POA: Diagnosis not present

## 2024-11-04 DIAGNOSIS — S161XXA Strain of muscle, fascia and tendon at neck level, initial encounter: Secondary | ICD-10-CM | POA: Insufficient documentation

## 2024-11-04 DIAGNOSIS — Z79899 Other long term (current) drug therapy: Secondary | ICD-10-CM | POA: Insufficient documentation

## 2024-11-04 DIAGNOSIS — Y92410 Unspecified street and highway as the place of occurrence of the external cause: Secondary | ICD-10-CM | POA: Diagnosis not present

## 2024-11-04 MED ORDER — KETOROLAC TROMETHAMINE 15 MG/ML IJ SOLN
15.0000 mg | Freq: Once | INTRAMUSCULAR | Status: AC
  Administered 2024-11-04: 15 mg via INTRAMUSCULAR
  Filled 2024-11-04: qty 1

## 2024-11-04 MED ORDER — CYCLOBENZAPRINE HCL 10 MG PO TABS: 10.0000 mg | ORAL_TABLET | Freq: Three times a day (TID) | ORAL | 0 refills | Status: AC
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
## 2024-11-04 NOTE — Telephone Encounter (Signed)
 Noted.  Will close encounter.
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
## 2024-11-04 NOTE — Telephone Encounter (Signed)
 When calling patient for reminder call she stated she was in the ED getting checked out and would cancel the appointment tomorrow but will call us  when she gets the treatment plan from the ED and reschedule for a follow up with Dr. Penne.
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
## 2024-11-04 NOTE — Telephone Encounter (Signed)
 Scheduled 11/12 with PCP.

## 2024-11-04 NOTE — Discharge Instructions (Addendum)
 You were given a prescription for muscle relaxers, please take 1 tablet up to 3 times a day to help with your muscle spasms.  You may also apply ice or heat to your the left side of the neck to help with control.

## 2024-11-05 ENCOUNTER — Ambulatory Visit (HOSPITAL_BASED_OUTPATIENT_CLINIC_OR_DEPARTMENT_OTHER): Admitting: Family Medicine

## 2024-11-13 ENCOUNTER — Ambulatory Visit (HOSPITAL_BASED_OUTPATIENT_CLINIC_OR_DEPARTMENT_OTHER): Admitting: Family Medicine
